# Patient Record
Sex: Male | Born: 1975
Health system: Southern US, Community
[De-identification: ages and names within clinical notes are randomized; demographics above are authoritative.]

## PROBLEM LIST (undated history)

## (undated) DIAGNOSIS — J45909 Unspecified asthma, uncomplicated: Secondary | ICD-10-CM

---

## 2016-01-03 ENCOUNTER — Emergency Department
Admission: EM | Admit: 2016-01-03 | Discharge: 2016-01-03 | Disposition: A | Discharge status: Home / Self Care | Payer: 59 | Attending: Emergency Medicine | Admitting: Emergency Medicine

## 2016-01-03 ENCOUNTER — Encounter: Payer: Self-pay | Admitting: *Deleted

## 2016-01-03 ENCOUNTER — Emergency Department: Payer: 59

## 2016-01-03 DIAGNOSIS — J209 Acute bronchitis, unspecified: Secondary | ICD-10-CM

## 2016-01-03 DIAGNOSIS — R1110 Vomiting, unspecified: Secondary | ICD-10-CM | POA: Diagnosis not present

## 2016-01-03 DIAGNOSIS — J069 Acute upper respiratory infection, unspecified: Secondary | ICD-10-CM | POA: Diagnosis not present

## 2016-01-03 DIAGNOSIS — B9789 Other viral agents as the cause of diseases classified elsewhere: Secondary | ICD-10-CM

## 2016-01-03 DIAGNOSIS — J45901 Unspecified asthma with (acute) exacerbation: Secondary | ICD-10-CM | POA: Insufficient documentation

## 2016-01-03 DIAGNOSIS — J988 Other specified respiratory disorders: Secondary | ICD-10-CM

## 2016-01-03 DIAGNOSIS — R51 Headache: Secondary | ICD-10-CM | POA: Diagnosis present

## 2016-01-03 HISTORY — DX: Unspecified asthma, uncomplicated: J45.909

## 2016-01-03 LAB — RAPID INFLUENZA A&B ANTIGENS: Influenza B (ARMC): NOT DETECTED

## 2016-01-03 LAB — RAPID INFLUENZA A&B ANTIGENS (ARMC ONLY): INFLUENZA A (ARMC): NOT DETECTED

## 2016-01-03 MED ORDER — NAPROXEN 500 MG PO TABS: 500.0000 mg | ORAL_TABLET | Freq: Two times a day (BID) | ORAL | Status: DC

## 2016-01-03 MED ORDER — ONDANSETRON 8 MG PO TBDP
8.0000 mg | ORAL_TABLET | Freq: Once | ORAL | Status: AC
  Administered 2016-01-03: 8 mg via ORAL
  Filled 2016-01-03: qty 1

## 2016-01-03 MED ORDER — ACETAMINOPHEN 325 MG PO TABS
ORAL_TABLET | ORAL | Status: AC
  Administered 2016-01-03: 650 mg via ORAL
  Filled 2016-01-03: qty 2

## 2016-01-03 MED ORDER — DEXAMETHASONE SODIUM PHOSPHATE 10 MG/ML IJ SOLN
10.0000 mg | Freq: Once | INTRAMUSCULAR | Status: AC
  Administered 2016-01-03: 10 mg via INTRAMUSCULAR
  Filled 2016-01-03: qty 1

## 2016-01-03 MED ORDER — ACETAMINOPHEN 325 MG PO TABS
650.0000 mg | ORAL_TABLET | Freq: Once | ORAL | Status: AC
  Administered 2016-01-03: 650 mg via ORAL

## 2016-01-03 MED ORDER — ONDANSETRON 8 MG PO TBDP: 8.0000 mg | ORAL_TABLET | Freq: Three times a day (TID) | ORAL | Status: DC | PRN

## 2016-01-03 MED ORDER — KETOROLAC TROMETHAMINE 30 MG/ML IJ SOLN
30.0000 mg | Freq: Once | INTRAMUSCULAR | Status: AC
  Administered 2016-01-03: 30 mg via INTRAMUSCULAR
  Filled 2016-01-03: qty 1

## 2016-01-03 NOTE — ED Notes (Signed)
Pt reports headache, cough, runny nose, and vomiting for several days.  Pt also reports having chills

## 2016-01-03 NOTE — ED Notes (Signed)
MD at bedside discussing d/c papers.

## 2016-01-03 NOTE — ED Provider Notes (Signed)
[facility] Emergency Department Provider Note  ____________________________________________  Time seen: 9:45 PM  I have reviewed the triage vital signs and the nursing notes.   HISTORY  Chief Complaint Headache; Cough; and Emesis    HPI George Patel is a 40 y.o. male who complains of generalized headache, nonproductive cough, runny nose, vomiting for 2 days. He also has subjective fever and chills. He further has diffuse myalgias and arthralgias. Has never had a flu test. Has had multiple sick coworkers recently with viral illnesses with vomiting. He has been tolerating oral intake and ate some yogurt earlier today and is drinking water.  No significant chest pain, no exertional symptoms. No syncope. No diarrhea or abdominal pain or back pain.   Past Medical History  Diagnosis Date  . Asthma      There are no active problems to display for this patient.    History reviewed. No pertinent past surgical history.   Current Outpatient Rx  Name  Route  Sig  Dispense  Refill  . naproxen (NAPROSYN) 500 MG tablet   Oral   Take 1 tablet (500 mg total) by mouth 2 (two) times daily with a meal.   20 tablet   0   . ondansetron (ZOFRAN ODT) 8 MG disintegrating tablet   Oral   Take 1 tablet (8 mg total) by mouth every 8 (eight) hours as needed for nausea or vomiting.   20 tablet   0      Allergies Review of patient's allergies indicates no known allergies.   No family history on file.  Social History Social History  Substance Use Topics  . Smoking status: Never Smoker   . Smokeless tobacco: None  . Alcohol Use: No    Review of Systems  Constitutional:  Positive fever and chills. No weight changes Eyes:   No blurry vision or double vision.  ENT:   Positive sore throat. Cardiovascular:   No chest pain. Respiratory:   Positive shortness of breath and nonproductive cough. Gastrointestinal:   Negative for abdominal pain, positive  vomiting.  No BRBPR or melena. Genitourinary:   Negative for dysuria, urinary retention, bloody urine, or difficulty urinating. Musculoskeletal:   Negative for back pain. No joint swelling or pain. Skin:   Negative for rash. Neurological:   Negative for headaches, focal weakness or numbness. Psychiatric:  No anxiety or depression.   Endocrine:  No hot/cold intolerance, changes in energy, or sleep difficulty.  10-point ROS otherwise negative.  ____________________________________________   PHYSICAL EXAM:  VITAL SIGNS: ED Triage Vitals  Enc Vitals Group     BP 01/03/16 2110 135/91 mmHg     Pulse Rate 01/03/16 2110 93     Resp 01/03/16 2110 20     Temp 01/03/16 2110 99.1 F (37.3 C)     Temp Source 01/03/16 2110 Oral     SpO2 01/03/16 2110 97 %     Weight 01/03/16 2110 159 lb (72.122 kg)     Height 01/03/16 2110 5\' 9"  (1.753 m)     Head Cir --      Peak Flow --      Pain Score 01/03/16 2111 8     Pain Loc --      Pain Edu? --      Excl. in GC? --     Vital signs reviewed, nursing assessments reviewed.   Constitutional:   Alert and oriented. Well appearing and in no distress. Eyes:   No scleral icterus. No conjunctival  pallor. PERRL. EOMI ENT   Head:   Normocephalic and atraumatic.   Nose:   Positive nasal congestion. No septal hematoma   Mouth/Throat:   MMM, mild pharyngeal erythema. No peritonsillar mass. No uvula shift.   Neck:   No stridor. No SubQ emphysema. No meningismus. Hematological/Lymphatic/Immunilogical:   No cervical lymphadenopathy. Cardiovascular:   RRR. Normal and symmetric distal pulses are present in all extremities. No murmurs, rubs, or gallops. Respiratory:   Normal respiratory effort without tachypnea nor retractions. Coarse inspiratory breath sounds diffusely without focal findings. No expiratory wheezes.  Gastrointestinal:   Soft and nontender. No distention. There is no CVA tenderness.  No rebound, rigidity, or  guarding. Genitourinary:   deferred Musculoskeletal:   Nontender with normal range of motion in all extremities. No joint effusions.  No lower extremity tenderness.  No edema. Neurologic:   Normal speech and language.  CN 2-10 normal. Motor grossly intact. No pronator drift.  Normal gait. No gross focal neurologic deficits are appreciated.  Skin:    Skin is warm, dry and intact. No rash noted.  No petechiae, purpura, or bullae. Psychiatric:   Mood and affect are normal. Speech and behavior are normal. Patient exhibits appropriate insight and judgment.  ____________________________________________    LABS (pertinent positives/negatives) (all labs ordered are listed, but only abnormal results are displayed) Labs Reviewed  RAPID INFLUENZA A&B ANTIGENS ([facility] ONLY)   ____________________________________________   EKG    ____________________________________________    RADIOLOGY  Chest x-ray with mild bronchitic changes. No focal consolidation. No pneumothorax.  ____________________________________________   PROCEDURES   ____________________________________________   INITIAL IMPRESSION / ASSESSMENT AND PLAN / ED COURSE  Pertinent labs & imaging results that were available during my care of the patient were reviewed by me and considered in my medical decision making (see chart for details).  Patient presents with constellation of symptoms consistent with a viral respiratory illness and influenza-like illness. We'll check a flu test and chest x-ray were also given the patient Decadron Toradol and Zofran. We'll confirm that he is tolerating oral intake so that he'll go to keep himself hydrated while this illness runs its course.Considering the patient's symptoms, medical history, and physical examination today, I have low suspicion for ACS, PE, TAD, pneumothorax, carditis, mediastinitis, pneumonia, CHF, or sepsis.    ----------------------------------------- 10:37 PM on  01/03/2016 -----------------------------------------  Tolerating oral intake. Chest x-ray suggestive of bronchitis. Otherwise well-appearing. No influenza. We'll control symptoms and have him follow up with primary care.     ____________________________________________   FINAL CLINICAL IMPRESSION(S) / ED DIAGNOSES  Final diagnoses:  Acute bronchitis, unspecified organism  Viral respiratory illness      Sharman CheekPhillip Adrin Julian, MD 01/03/16 2238

## 2016-01-03 NOTE — Discharge Instructions (Signed)
Bronquitis aguda (Acute Bronchitis) La bronquitis es una inflamacin de las vas respiratorias que se extienden desde la trquea [facility] pulmones (bronquios). La inflamacin produce la formacin de mucosidad. Esto produce tos, que es el sntoma ms frecuente de la bronquitis.  Cuando la bronquitis es [place], generalmente comienza de [place] sbita y desaparece luego de un par de semanas. El hbito de fumar, las alergias y el asma pueden empeorar la bronquitis. Los episodios repetidos de bronquitis pueden causar ms problemas pulmonares.  CAUSAS La causa ms frecuente de bronquitis aguda es el mismo virus que produce el resfro. El virus puede propagarse de Ardelia Mems persona a la otra (contagioso) a travs de la tos y los estornudos, y al tocar objetos contaminados. Sharon.  Cristy Hilts.  Tos con mucosidad.  Dolores [employer] cuerpo.  Congestin en el pecho.  Escalofros.  Falta de aire.  Dolor de Investment banker, operational. DIAGNSTICO  La bronquitis aguda en general se diagnostica con un examen fsico. El mdico tambin le har preguntas sobre su historia clnica. En algunos casos se indican otros estudios, como radiografas, para Clinical research associate.  TRATAMIENTO  La bronquitis aguda generalmente desaparece en un par de semanas. Con frecuencia, no es Systems analyst. Los medicamentos se indican para aliviar la fiebre o la tos. Generalmente, no es necesario el uso de antibiticos, pero pueden recetarse en ciertas ocasiones. En algunos casos, se recomienda el uso de un inhalador para mejorar la falta de aire y Aeronautical engineer tos. Un vaporizador de aire fro podr ayudarlo a [employer] bronquiales y Armed forces technical officer su eliminacin.  INSTRUCCIONES PARA EL CUIDADO EN EL HOGAR  Descanse lo suficiente.  Beba lquidos en abundancia para mantener la orina de color claro o amarillo plido (excepto que padezca una enfermedad que requiera la restriccin de lquidos). El aumento  de lquidos puede ayudar a que las secreciones respiratorias (esputo) sean menos espesas y a reducir la congestin del pecho, y Mining engineer deshidratacin.  Tome los medicamentos solamente como se lo haya indicado el mdico.  Si le recetaron antibiticos, asegrese de terminarlos, incluso si comienza a sentirse mejor.  Evite fumar o aspirar el humo de otros fumadores. La exposicin al humo del cigarrillo o a irritantes qumicos har que la bronquitis empeore. Si fuma, considere el uso de goma de Higher education careers adviser o la aplicacin de parches en la piel que contengan nicotina para Public house manager los sntomas de abstinencia. Si deja de fumar, sus pulmones se curarn ms rpido.  Reduzca la probabilidad de otro episodio de bronquitis aguda lavando sus manos con frecuencia, evitando a las personas que tengan sntomas y tratando de no tocarse las manos con la boca, la nariz o los ojos.  Concurra a todas las visitas de control como se lo haya indicado el mdico. SOLICITE ATENCIN MDICA SI: Los sntomas no mejoran despus de una semana de [place].  SOLICITE ATENCIN MDICA DE INMEDIATO SI:  Comienza a tener fiebre o escalofros cada vez ms intensos.  Siente dolor en el pecho.  Le falta el aire de manera preocupante.  La flema tiene [place].  Se deshidrata.  Se desmaya o siente que va a desmayarse de forma repetida.  Tiene vmitos que se repiten.  Tiene un dolor de cabeza intenso. ASEGRESE DE QUE:   Comprende estas instrucciones.  Controlar su afeccin.  Recibir ayuda de inmediato si no mejora o si empeora.   Esta informacin no tiene Marine scientist el consejo del mdico. Asegrese de hacerle al mdico cualquier  pregunta que tenga.   Document Released: 12/10/2005 Document Revised: 12/31/2014 Elsevier Interactive Patient Education 2016 [employer]  Infecciones virales (Viral Infections) La causa de las infecciones virales son diferentes tipos de virus.La mayora de las infecciones  virales no son graves y se curan solas. Sin embargo, algunas infecciones pueden provocar sntomas graves y causar complicaciones.  SNTOMAS Las infecciones virales ocasionan:   Dolores de Advertising copywritergarganta.  Molestias.  Dolor de Turkmenistancabeza.  Mucosidad nasal.  Diferentes tipos de erupcin.  Lagrimeo.  Cansancio.  Tos.  Prdida del apetito.  Infecciones gastrointestinales que producen nuseas, vmitos y Guineadiarrea. Estos sntomas no responden a los antibiticos porque la infeccin no es por bacterias. Sin embargo, puede sufrir una infeccin bacteriana luego de la infeccin viral. Se denomina sobreinfeccin. Los sntomas de esta infeccin bacteriana son:   Jefferson Fuelmpeora el dolor en la garganta con pus y dificultad para tragar.  Ganglios hinchados en el cuello.  Escalofros y fiebre muy elevada o persistente.  Dolor de cabeza intenso.  Sensibilidad en los senos paranasales.  Malestar (sentirse enfermo) general persistente, dolores musculares y fatiga (cansancio).  Tos persistente.  Produccin mucosa con la tos, de color amarillo, verde o marrn. INSTRUCCIONES PARA EL CUIDADO DOMICILIARIO  Solo tome medicamentos que se pueden comprar sin receta o recetados para Chief Technology Officerel dolor, Dentistmalestar, la diarrea o la fiebre, como le indica el mdico.  Beba gran cantidad de lquido para mantener la orina de tono claro o color amarillo plido. Las bebidas deportivas proporcionan electrolitos,azcares e hidratacin.  Descanse lo suficiente y Abbott Laboratoriesalimntese bien. Puede tomar sopas y caldos con crackers o arroz. SOLICITE ATENCIN MDICA DE INMEDIATO SI:  Tiene dolor de cabeza, le falta el aire, siente dolor en el pecho, en el cuello o aparece una erupcin.  Tiene vmitos o diarrea intensos y no puede retener lquidos.  Usted o su nio tienen una temperatura oral de ms de 38,9 C (102 F) y no puede controlarla con medicamentos.  Su beb tiene ms de 3 meses y su temperatura rectal es de 102 F (38.9 C) o ms.  Su  beb tiene 3 meses o menos y su temperatura rectal es de 100.4 F (38 C) o ms. EST SEGURO QUE:   Comprende las instrucciones para el alta mdica.  Controlar su enfermedad.  Solicitar atencin mdica de inmediato segn las indicaciones.   Esta informacin no tiene Theme park managercomo fin reemplazar el consejo del mdico. Asegrese de hacerle al mdico cualquier pregunta que tenga.   Document Released: 09/19/2005 Document Revised: 03/03/2012 Elsevier Interactive Patient Education [employer]2016 Elsevier Inc.

## 2016-01-03 NOTE — ED Notes (Signed)
Patient transported to X-ray 

## 2016-12-11 ENCOUNTER — Encounter: Payer: Self-pay | Admitting: Emergency Medicine

## 2016-12-11 ENCOUNTER — Emergency Department
Admission: EM | Admit: 2016-12-11 | Discharge: 2016-12-11 | Disposition: A | Discharge status: Home / Self Care | Payer: 59 | Attending: Emergency Medicine | Admitting: Emergency Medicine

## 2016-12-11 ENCOUNTER — Emergency Department: Payer: 59

## 2016-12-11 DIAGNOSIS — R197 Diarrhea, unspecified: Secondary | ICD-10-CM | POA: Insufficient documentation

## 2016-12-11 DIAGNOSIS — J45909 Unspecified asthma, uncomplicated: Secondary | ICD-10-CM | POA: Insufficient documentation

## 2016-12-11 DIAGNOSIS — R1031 Right lower quadrant pain: Secondary | ICD-10-CM | POA: Diagnosis not present

## 2016-12-11 DIAGNOSIS — R1030 Lower abdominal pain, unspecified: Secondary | ICD-10-CM

## 2016-12-11 LAB — COMPREHENSIVE METABOLIC PANEL
ALBUMIN: 4.2 g/dL (ref 3.5–5.0)
ALT: 33 U/L (ref 17–63)
ANION GAP: 7 (ref 5–15)
AST: 26 U/L (ref 15–41)
Alkaline Phosphatase: 59 U/L (ref 38–126)
BUN: 16 mg/dL (ref 6–20)
CHLORIDE: 104 mmol/L (ref 101–111)
CO2: 26 mmol/L (ref 22–32)
Calcium: 8.9 mg/dL (ref 8.9–10.3)
Creatinine, Ser: 0.98 mg/dL (ref 0.61–1.24)
GFR calc non Af Amer: >60 mL/min (ref >60)
Glucose, Bld: 100 mg/dL — ABNORMAL HIGH (ref 65–99)
POTASSIUM: 3.9 mmol/L (ref 3.5–5.1)
SODIUM: 137 mmol/L (ref 135–145)
Total Bilirubin: 0.8 mg/dL (ref 0.3–1.2)
Total Protein: 7.6 g/dL (ref 6.5–8.1)

## 2016-12-11 LAB — URINALYSIS, COMPLETE (UACMP) WITH MICROSCOPIC
BILIRUBIN URINE: NEGATIVE
Glucose, UA: NEGATIVE mg/dL
HGB URINE DIPSTICK: NEGATIVE
KETONES UR: NEGATIVE mg/dL
LEUKOCYTES UA: NEGATIVE
NITRITE: NEGATIVE
PROTEIN: NEGATIVE mg/dL
SPECIFIC GRAVITY, URINE: 1.009 (ref 1.005–1.030)
pH: 5 (ref 5.0–8.0)

## 2016-12-11 LAB — CBC WITH DIFFERENTIAL/PLATELET
BASOS PCT: 1 %
Basophils Absolute: 0 10*3/uL (ref 0–0.1)
EOS ABS: 0.4 10*3/uL (ref 0–0.7)
EOS PCT: 5 %
HCT: 46.6 % (ref 40.0–52.0)
Hemoglobin: 15.9 g/dL (ref 13.0–18.0)
LYMPHS ABS: 1.4 10*3/uL (ref 1.0–3.6)
Lymphocytes Relative: 19 %
MCH: 30.1 pg (ref 26.0–34.0)
MCHC: 34.2 g/dL (ref 32.0–36.0)
MCV: 88.2 fL (ref 80.0–100.0)
MONOS PCT: 8 %
Monocytes Absolute: 0.6 10*3/uL (ref 0.2–1.0)
Neutro Abs: 5 10*3/uL (ref 1.4–6.5)
Neutrophils Relative %: 67 %
PLATELETS: 178 10*3/uL (ref 150–440)
RBC: 5.29 MIL/uL (ref 4.40–5.90)
RDW: 13.9 % (ref 11.5–14.5)
WBC: 7.4 10*3/uL (ref 3.8–10.6)

## 2016-12-11 LAB — LIPASE, BLOOD: LIPASE: 16 U/L (ref 11–51)

## 2016-12-11 MED ORDER — IOPAMIDOL (ISOVUE-300) INJECTION 61%
100.0000 mL | Freq: Once | INTRAVENOUS | Status: AC | PRN
  Administered 2016-12-11: 100 mL via INTRAVENOUS

## 2016-12-11 MED ORDER — IOPAMIDOL (ISOVUE-300) INJECTION 61%
30.0000 mL | Freq: Once | INTRAVENOUS | Status: AC | PRN
  Administered 2016-12-11: 30 mL via ORAL

## 2016-12-11 MED ORDER — ONDANSETRON 4 MG PO TBDP: ORAL_TABLET | ORAL | 0 refills | Status: DC

## 2016-12-11 MED ORDER — SODIUM CHLORIDE 0.9 % IV BOLUS (SEPSIS)
1000.0000 mL | Freq: Once | INTRAVENOUS | Status: AC
  Administered 2016-12-11: 1000 mL via INTRAVENOUS

## 2016-12-11 NOTE — Discharge Instructions (Signed)
Take tylenol, motrin for pain,.   Take zofran for nausea.   Stay hydrated.   See your doctor  Return to ER if you have severe abdominal pain, vomiting, fevers.

## 2016-12-11 NOTE — ED Provider Notes (Addendum)
[facility]-EMERGENCY DEPARTMENT Provider Note   CSN: [id number] Arrival date & time: 12/11/16  0813     History   Chief Complaint Chief Complaint  Patient presents with  . Abdominal Pain    HPI George Patel is a 40 y.o. male hx of asthma, Here presenting with abdominal pain, diarrhea. Patient states that he's been having right lower quadrant pain for the last 4 days. Pain is intermittent and slowly getting worse. Patient states that he has decreased appetite and has diarrhea every time he eats. He has a child with similar symptoms. Denies any vomiting or fevers. Her previous abdominal surgeries.   The history is provided by the patient.    Past Medical History:  Diagnosis Date  . Asthma     There are no active problems to display for this patient.   History reviewed. No pertinent surgical history.     Home Medications    Prior to Admission medications   Medication Sig Start Date End Date Taking? Authorizing Provider  ibuprofen (ADVIL,MOTRIN) 200 MG tablet Take 800 mg by mouth every 4 (four) hours as needed.   Yes Historical Provider, MD  naproxen (NAPROSYN) 500 MG tablet Take 1 tablet (500 mg total) by mouth 2 (two) times daily with a meal. Patient not taking: Reported on 12/11/2016 01/03/16   Sharman CheekPhillip Stafford, MD  ondansetron (ZOFRAN ODT) 8 MG disintegrating tablet Take 1 tablet (8 mg total) by mouth every 8 (eight) hours as needed for nausea or vomiting. Patient not taking: Reported on 12/11/2016 01/03/16   Sharman CheekPhillip Stafford, MD    Family History No family history on file.  Social History Social History  Substance Use Topics  . Smoking status: Never Smoker  . Smokeless tobacco: Never Used  . Alcohol use No     Allergies   Patient has no known allergies.   Review of Systems Review of Systems  Gastrointestinal: Positive for abdominal pain.  All other systems reviewed and are negative.    Physical Exam Updated Vital Signs BP 129/85 (BP Location:  Right Arm)   Pulse 85   Temp 97.4 F (36.3 C) (Oral)   Resp 20   Ht 5\' 9"  (1.753 m)   Wt 161 lb (73 kg)   SpO2 100%   BMI 23.78 kg/m   Physical Exam  Constitutional: He appears well-developed and well-nourished.  HENT:  Head: Normocephalic.  Mouth/Throat: Oropharynx is clear and moist.  Eyes: Pupils are equal, round, and reactive to light.  Neck: Normal range of motion. Neck supple.  Cardiovascular: Normal rate, regular rhythm and normal heart sounds.   Pulmonary/Chest: Effort normal and breath sounds normal. No respiratory distress.  Abdominal: Soft. Bowel sounds are normal.  Mild RLQ tenderness, no rebound.   Genitourinary:  Genitourinary Comments: No testicular tenderness or mass   Musculoskeletal: Normal range of motion.  Neurological: He is alert.  Skin: Skin is warm.  Psychiatric: He has a normal mood and affect.  Nursing note and vitals reviewed.    ED Treatments / Results  Labs (all labs ordered are listed, but only abnormal results are displayed) Labs Reviewed  COMPREHENSIVE METABOLIC PANEL - Abnormal; Notable for the following:       Result Value   Glucose, Bld 100 (*)    All other components within normal limits  URINALYSIS, COMPLETE (UACMP) WITH MICROSCOPIC - Abnormal; Notable for the following:    Color, Urine STRAW (*)    APPearance CLEAR (*)    Bacteria, UA RARE (*)  Squamous Epithelial / LPF 0-5 (*)    All other components within normal limits  CBC WITH DIFFERENTIAL/PLATELET  LIPASE, BLOOD    EKG  EKG Interpretation None       Radiology Ct Abdomen Pelvis W Contrast  Result Date: 12/11/2016 CLINICAL DATA:  Right lower quadrant pain since 12/07/2016. No known injury. EXAM: CT ABDOMEN AND PELVIS WITH CONTRAST TECHNIQUE: Multidetector CT imaging of the abdomen and pelvis was performed using the standard protocol following bolus administration of intravenous contrast. CONTRAST:  100 ml ISOVUE-300 IOPAMIDOL (ISOVUE-300) INJECTION 61%  COMPARISON:  None. FINDINGS: Lower chest: Lung bases are clear. No pleural or pericardial effusion. Hepatobiliary: 0.4 cm hypoattenuating lesion in the right hepatic lobe on image 12 is likely a cyst. A second hypoattenuating lesion in the left hepatic lobe measures 0.7 cm and is also likely a cyst. The liver is otherwise unremarkable. The gallbladder and biliary tree appear normal. Pancreas: Unremarkable. No pancreatic ductal dilatation or surrounding inflammatory changes. Spleen: Normal in size without focal abnormality. Adrenals/Urinary Tract: Adrenal glands are unremarkable. Kidneys are normal, without renal calculi, focal lesion, or hydronephrosis. Bladder is unremarkable. Stomach/Bowel: Stomach is within normal limits. Appendix appears normal. No evidence of bowel wall thickening, distention, or inflammatory changes. Vascular/Lymphatic: No significant vascular findings are present. No enlarged abdominal or pelvic lymph nodes. Reproductive: Prostate is unremarkable. Other: Very small fat containing umbilical hernia is noted. No fluid collection. Musculoskeletal: Negative. IMPRESSION: Negative for appendicitis. No acute abnormality or finding to explain patient's symptoms. Small fat containing umbilical hernia. Electronically Signed   By: Drusilla Kannerhomas  Dalessio M.D.   On: 12/11/2016 11:27    Procedures Procedures (including critical care time)  Medications Ordered in ED Medications  sodium chloride 0.9 % bolus 1,000 mL (1,000 mLs Intravenous New Bag/Given 12/11/16 1030)  iopamidol (ISOVUE-300) 61 % injection 30 mL (30 mLs Oral Contrast Given 12/11/16 0901)  iopamidol (ISOVUE-300) 61 % injection 100 mL (100 mLs Intravenous Contrast Given 12/11/16 1109)     Initial Impression / Assessment and Plan / ED Course  I have reviewed the triage vital signs and the nursing notes.  Pertinent labs & imaging results that were available during my care of the patient were reviewed by me and considered in my medical  decision making (see chart for details).  Clinical Course    George Patel is a 40 y.o. male here with RLQ pain, diarrhea. Likely gastro vs appendicitis. Will get labs, CT ab/pel, UA.   11:36 AM Labs unremarkable. CT showed nl appendix. Tolerated PO. Will dc home.   Final Clinical Impressions(s) / ED Diagnoses   Final diagnoses:  None    New Prescriptions New Prescriptions   No medications on file     Charlynne Panderavid Hsienta Miquela Costabile, MD 12/11/16 1138    Charlynne Panderavid Hsienta Rhythm Gubbels, MD 12/11/16 1139

## 2016-12-11 NOTE — ED Triage Notes (Signed)
Sharp RLQ pain since Friday.  Diarrhea.

## 2017-12-19 IMAGING — CR DG CHEST 2V
2 series · 2 of 2 positions shown · non-contrast
Comparison: None

CLINICAL DATA: Fever, cough, dyspnea and vomiting.

EXAM:
CHEST - 2 VIEW

[chest pa]
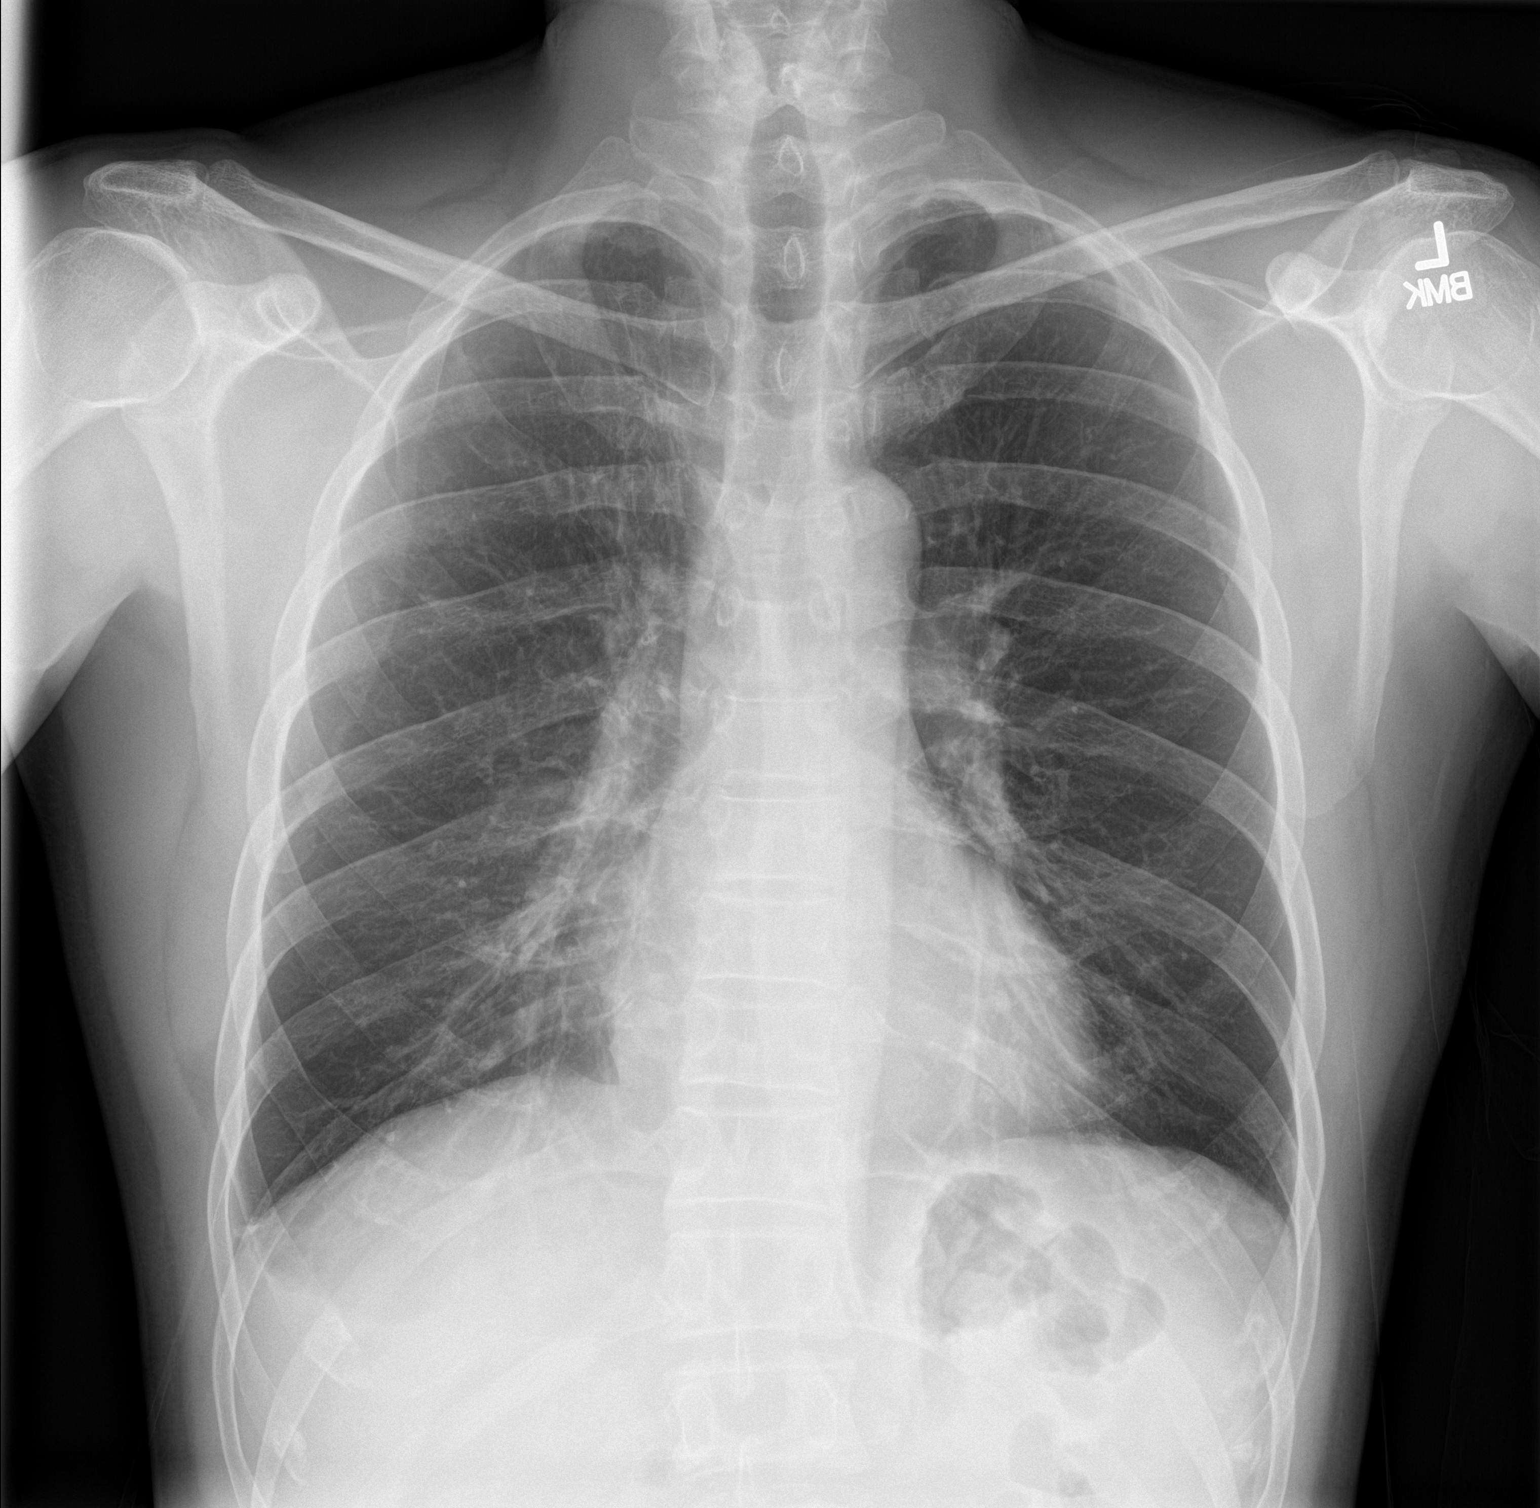

[chest lat]
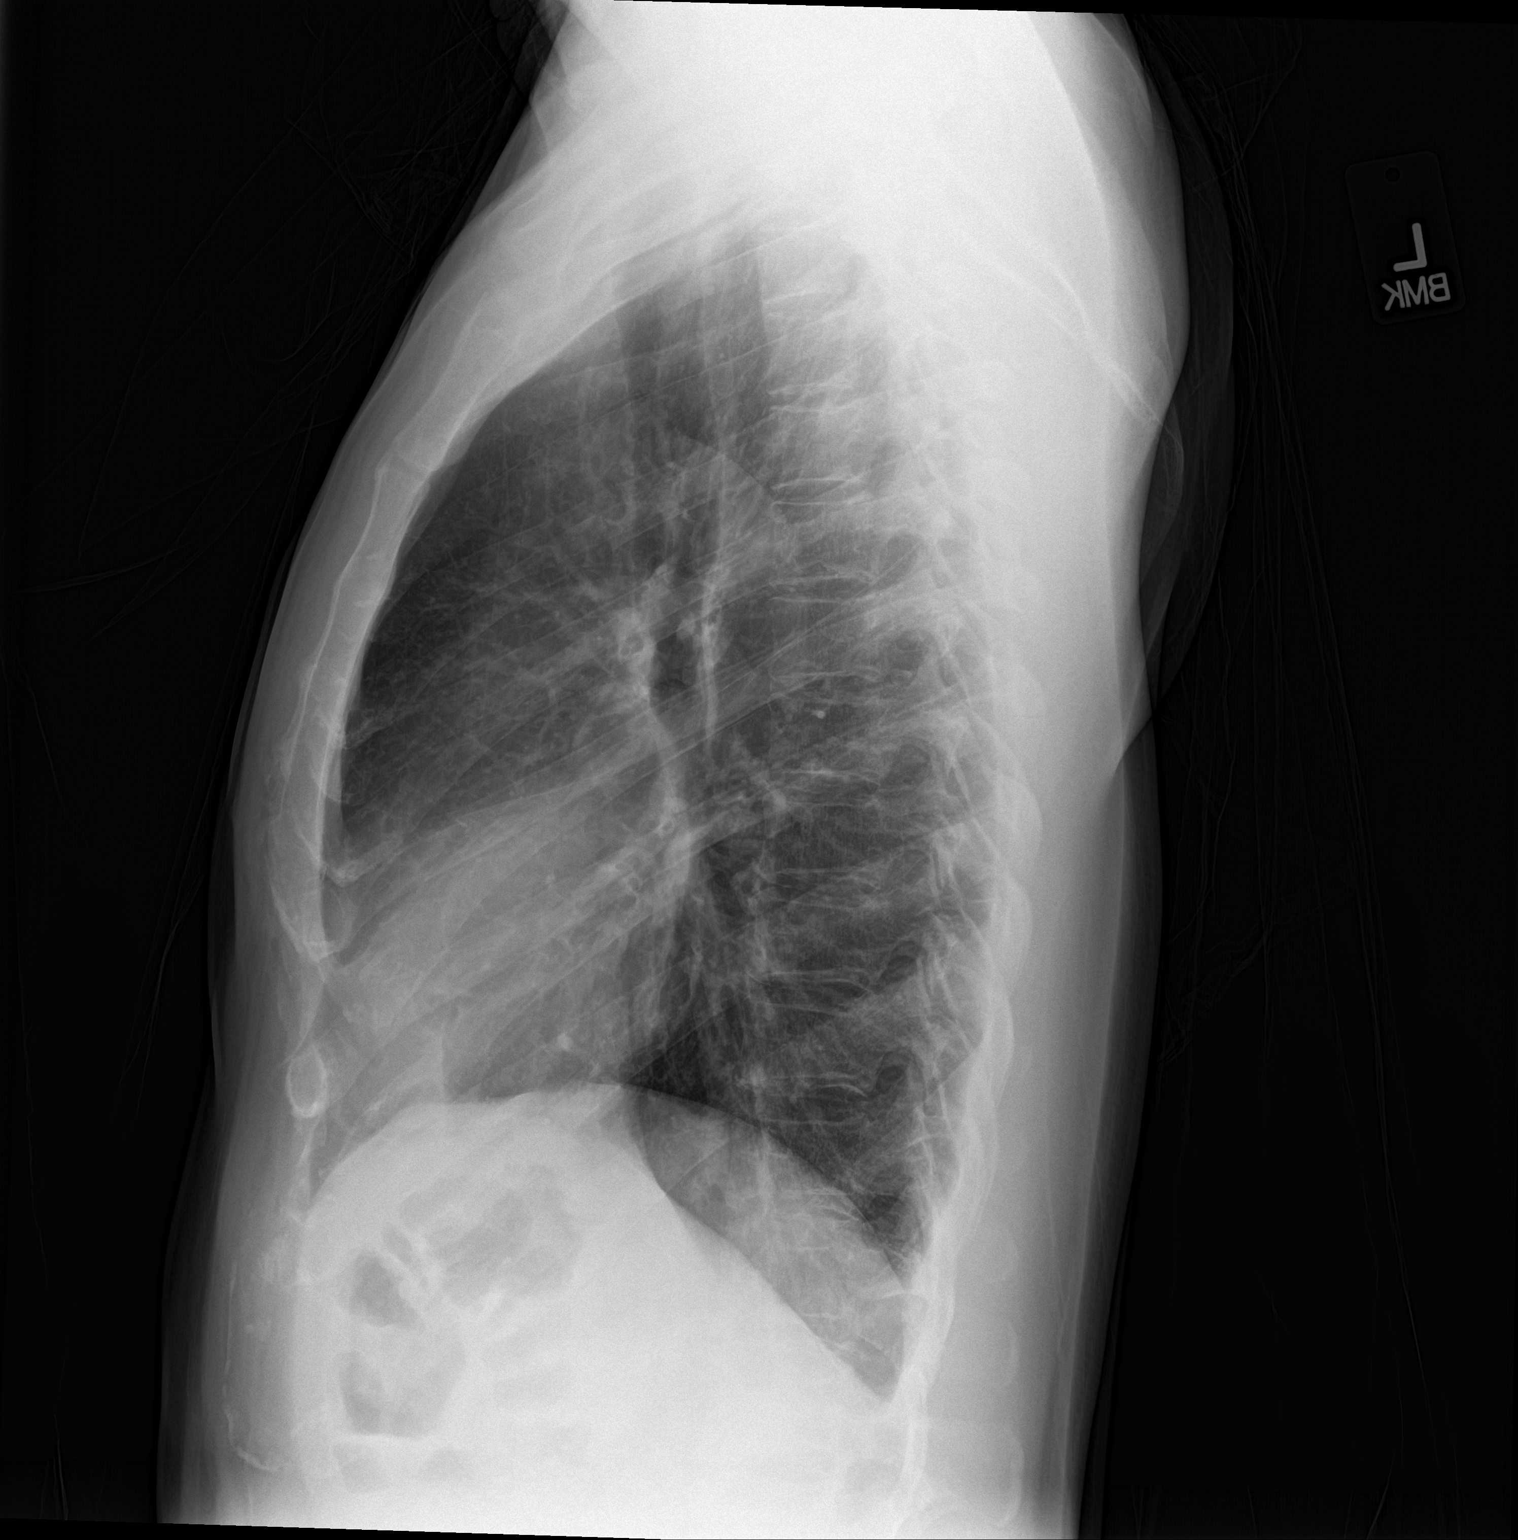

[2 of 2 positions shown; findings below may reference images not displayed]

FINDINGS: The heart size and mediastinal contours are within normal limits.
Bilateral lower lung scarring and atelectasis present. There may be
some mild bronchial thickening as well in the lower lobes. No focal
airspace consolidation, edema or pleural fluid identified. No
evidence of pneumothorax. The visualized skeletal structures are
unremarkable.
IMPRESSION: Scarring and atelectasis as well as potential bronchial thickening
in the lower lobes. No focal infiltrate identified.

## 2019-05-05 DIAGNOSIS — L03313 Cellulitis of chest wall: Secondary | ICD-10-CM | POA: Diagnosis not present

## 2019-05-07 DIAGNOSIS — Z5189 Encounter for other specified aftercare: Secondary | ICD-10-CM | POA: Diagnosis not present

## 2019-05-07 DIAGNOSIS — L03313 Cellulitis of chest wall: Secondary | ICD-10-CM | POA: Diagnosis not present

## 2020-11-06 ENCOUNTER — Emergency Department: Payer: 59

## 2020-11-06 ENCOUNTER — Encounter: Payer: Self-pay | Admitting: Emergency Medicine

## 2020-11-06 ENCOUNTER — Emergency Department
Admission: EM | Admit: 2020-11-06 | Discharge: 2020-11-06 | Disposition: A | Discharge status: Home / Self Care | Payer: 59 | Attending: Emergency Medicine | Admitting: Emergency Medicine

## 2020-11-06 ENCOUNTER — Other Ambulatory Visit: Payer: Self-pay

## 2020-11-06 DIAGNOSIS — J45909 Unspecified asthma, uncomplicated: Secondary | ICD-10-CM | POA: Insufficient documentation

## 2020-11-06 DIAGNOSIS — S61411A Laceration without foreign body of right hand, initial encounter: Secondary | ICD-10-CM | POA: Insufficient documentation

## 2020-11-06 DIAGNOSIS — S61219A Laceration without foreign body of unspecified finger without damage to nail, initial encounter: Secondary | ICD-10-CM

## 2020-11-06 DIAGNOSIS — S61421A Laceration with foreign body of right hand, initial encounter: Secondary | ICD-10-CM

## 2020-11-06 DIAGNOSIS — Z23 Encounter for immunization: Secondary | ICD-10-CM | POA: Diagnosis not present

## 2020-11-06 DIAGNOSIS — W268XXA Contact with other sharp object(s), not elsewhere classified, initial encounter: Secondary | ICD-10-CM | POA: Insufficient documentation

## 2020-11-06 DIAGNOSIS — S6991XA Unspecified injury of right wrist, hand and finger(s), initial encounter: Secondary | ICD-10-CM | POA: Diagnosis present

## 2020-11-06 MED ORDER — CEPHALEXIN 500 MG PO CAPS
500.0000 mg | ORAL_CAPSULE | Freq: Once | ORAL | Status: AC
  Administered 2020-11-06: 500 mg via ORAL
  Filled 2020-11-06: qty 1

## 2020-11-06 MED ORDER — TETANUS-DIPHTH-ACELL PERTUSSIS 5-2.5-18.5 LF-MCG/0.5 IM SUSY
0.5000 mL | PREFILLED_SYRINGE | Freq: Once | INTRAMUSCULAR | Status: AC
  Administered 2020-11-06: 0.5 mL via INTRAMUSCULAR
  Filled 2020-11-06: qty 0.5

## 2020-11-06 MED ORDER — LIDOCAINE HCL (PF) 1 % IJ SOLN
10.0000 mL | Freq: Once | INTRAMUSCULAR | Status: DC
  Filled 2020-11-06: qty 10

## 2020-11-06 MED ORDER — SULFAMETHOXAZOLE-TRIMETHOPRIM 800-160 MG PO TABS: 1.0000 | ORAL_TABLET | Freq: Two times a day (BID) | ORAL | 0 refills | Status: AC

## 2020-11-06 MED ORDER — LIDOCAINE HCL (PF) 1 % IJ SOLN
5.0000 mL | Freq: Once | INTRAMUSCULAR | Status: AC
  Administered 2020-11-06: 5 mL
  Filled 2020-11-06: qty 5

## 2020-11-06 MED ORDER — HYDROCODONE-ACETAMINOPHEN 5-325 MG PO TABS: 1.0000 | ORAL_TABLET | Freq: Three times a day (TID) | ORAL | 0 refills | Status: AC | PRN

## 2020-11-06 MED ORDER — SULFAMETHOXAZOLE-TRIMETHOPRIM 800-160 MG PO TABS
1.0000 | ORAL_TABLET | Freq: Once | ORAL | Status: AC
  Administered 2020-11-06: 1 via ORAL
  Filled 2020-11-06: qty 1

## 2020-11-06 MED ORDER — CEPHALEXIN 500 MG PO CAPS: 500.0000 mg | ORAL_CAPSULE | Freq: Three times a day (TID) | ORAL | 0 refills | Status: AC

## 2020-11-06 NOTE — Discharge Instructions (Addendum)
Keep the wound clean, dry, and covered.  Use the right hand as tolerated over the next few weeks.  Have the sutures removed in 10 to 12 days, at a local urgent care.  Return to the ED for signs of infection.  Take antibiotics as directed and the pain medicine as needed.

## 2020-11-06 NOTE — ED Notes (Signed)
See triage note- Pt here with approx 2 inch laceration to right index finger. Patient reports right middle finger is numb and he is unable to move it. Pt reports cleaning the wound with hydrogen peroxide after the incident and states he could see the bone moving. Dressing in place and bleeding well controlled at this time.

## 2020-11-06 NOTE — ED Triage Notes (Signed)
Pt reports was cutting a piece of steel and the grinder kicked back and cut his right hand knuckles

## 2020-11-07 NOTE — ED Provider Notes (Signed)
[facility] Emergency Department Provider Note ____________________________________________  Time seen: 1326  I have reviewed the triage vital signs and the nursing notes.  HISTORY  Chief Complaint  Laceration  HPI George Patel is a 44 y.o. male presents himself to the ED for evaluation of an acute left hand laceration injury.  Patient was using a grinder at home,  cutting a piece of steel.  The grinder kicked off the metal, causing the patient's hand to basically punched the metal.  He sustained a deep laceration over the index finger knuckle.  No deformity or disabilities reported at this time.  Patient is unclear of his current tetanus status.  Past Medical History:  Diagnosis Date  . Asthma     There are no problems to display for this patient.   History reviewed. No pertinent surgical history.  Prior to Admission medications   Medication Sig Start Date End Date Taking? Authorizing Provider  cephALEXin (KEFLEX) 500 MG capsule Take 1 capsule (500 mg total) by mouth 3 (three) times daily for 7 days. 11/06/20 11/13/20  Clementine Soulliere, Charlesetta Ivory, PA-C  HYDROcodone-acetaminophen (NORCO) 5-325 MG tablet Take 1 tablet by mouth 3 (three) times daily as needed for up to 3 days. 11/06/20 11/09/20  Mairen Wallenstein, Charlesetta Ivory, PA-C  ibuprofen (ADVIL,MOTRIN) 200 MG tablet Take 800 mg by mouth every 4 (four) hours as needed.    [provider]  sulfamethoxazole-trimethoprim (BACTRIM DS) 800-160 MG tablet Take 1 tablet by mouth 2 (two) times daily. 11/06/20   Sherica Paternostro, Charlesetta Ivory, PA-C    Allergies Patient has no known allergies.  No family history on file.  Social History Social History   Tobacco Use  . Smoking status: Never Smoker  . Smokeless tobacco: Never Used  Substance Use Topics  . Alcohol use: No  . Drug use: Not on file    Review of Systems  Constitutional: Negative for fever. Cardiovascular: Negative for chest  pain. Respiratory: Negative for shortness of breath. Gastrointestinal: Negative for abdominal pain, vomiting and diarrhea. Musculoskeletal: Negative for back pain.  Right hand laceration as above. Skin: Negative for rash. Neurological: Negative for headaches, focal weakness or numbness. ____________________________________________  PHYSICAL EXAM:  VITAL SIGNS: ED Triage Vitals  Enc Vitals Group     BP 11/06/20 1208 138/86     Pulse Rate 11/06/20 1208 65     Resp 11/06/20 1208 19     Temp 11/06/20 1208 98.7 F (37.1 C)     Temp Source 11/06/20 1208 Oral     SpO2 11/06/20 1208 99 %     Weight 11/06/20 1205 175 lb (79.4 kg)     Height 11/06/20 1205 5\' 10"  (1.778 m)     Head Circumference --      Peak Flow --      Pain Score 11/06/20 1205 5     Pain Loc --      Pain Edu? --      Excl. in GC? --     Constitutional: Alert and oriented. Well appearing and in no distress. Head: Normocephalic and atraumatic. Eyes: Conjunctivae are normal. Normal extraocular movements Cardiovascular: Normal rate, regular rhythm. Normal distal pulses and cap refill Respiratory: Normal respiratory effort. Musculoskeletal: Right hand with obvious dorsal laceration over the second digit dorsal MCP.  There is extension to the underlying fascia but the extensor tendon is visible with normal function.  Nontender with normal range of motion in all other extremities.  Neurologic:  Normal gross sensation.  Normal intrinsic and opposition testing noted.  Normal speech and language. No gross focal neurologic deficits are appreciated. Skin:  Skin is warm, dry and intact. No rash noted. ____________________________________________   RADIOLOGY  DG Right Hand  IMPRESSION: No fracture or dislocation. No radiopaque foreign body. Moderately severe osteoarthritic change in the fifth PIP joint with milder osteoarthritic change in the second MCP joint. Other joint spaces appear  unremarkable. ____________________________________________  PROCEDURES  Tdap 0.5 ml IM Keflex 500 mg p.o. Bactrim DS 1 p.o.  Marland Kitchen.Laceration Repair  Date/Time: 11/06/2020 12:39 PM Performed by: Lissa Hoard, PA-C Authorized by: Lissa Hoard, PA-C   Consent:    Consent obtained:  Verbal   Consent given by:  Patient   Risks discussed:  Pain and infection Anesthesia (see MAR for exact dosages):    Anesthesia method:  Local infiltration   Local anesthetic:  Lidocaine 1% w/o epi Laceration details:    Location:  Hand   Hand location:  R hand, dorsum   Length (cm):  3   Depth (mm):  5 Repair type:    Repair type:  Intermediate Pre-procedure details:    Preparation:  Patient was prepped and draped in usual sterile fashion Exploration:    Wound exploration: wound explored through full range of motion     Wound extent: fascia violated and foreign bodies/material     Foreign bodies/material:  Grinding debris   Contaminated: yes   Treatment:    Area cleansed with:  Betadine and saline   Amount of cleaning:  Extensive   Irrigation solution:  Sterile saline   Irrigation method:  Syringe   Visualized foreign bodies/material removed: yes   Fascia repair:    Suture size:  5-0   Suture material:  Vicryl   Suture technique:  Simple interrupted   Number of sutures:  2 Skin repair:    Repair method:  Sutures   Suture size:  4-0   Suture material:  Nylon   Suture technique:  Simple interrupted   Number of sutures:  5 Approximation:    Approximation:  Close Post-procedure details:    Dressing:  Non-adherent dressing, bulky dressing and splint for protection   Patient tolerance of procedure:  Tolerated well, no immediate complications   ____________________________________________  INITIAL IMPRESSION / ASSESSMENT AND PLAN / ED COURSE  Patient with ED evaluation of an accidental laceration to the second dorsal CP of the right hand.  The laceration extends  into the underlying fascia but no tendon injury or disruption is noted.  The wound was closed in 2 layers and the patient is discharged to wound care instructions and supplies.  Fingers are buddy taped for additional support.  He is referred to Ortho for ongoing wound care management.  He is started empirically on Keflex and Bactrim for infection prophylaxis.  Tetanus is also boosted at this time.  A small prescription for hydrocodone was provided for moderate pain relief.  Return precautions have been discussed.  Daley Gosse was evaluated in Emergency Department on 11/07/2020 for the symptoms described in the history of present illness. He was evaluated in the context of the global COVID-19 pandemic, which necessitated consideration that the patient might be at risk for infection with the SARS-CoV-2 virus that causes COVID-19. Institutional protocols and algorithms that pertain to the evaluation of patients at risk for COVID-19 are in a state of rapid change based on information released by regulatory bodies including the CDC and federal and state organizations. These policies  and algorithms were followed during the patient's care in the ED.  I reviewed the patient's prescription history over the last 12 months in the multi-state controlled substances database(s) that includes [place], [place], [place], [place], [place], [place], [place], [place], [place], [place], [place], [place], [place], and [place].  Results were notable for no RX  History.  ____________________________________________  FINAL CLINICAL IMPRESSION(S) / ED DIAGNOSES  Final diagnoses:  Finger laceration involving tendon, initial encounter  Laceration of right hand with foreign body, initial encounter      Lissa Hoard, PA-C 11/07/20 1118    Merwyn Katos, MD 11/09/20 [phone]
